# Patient Record
Sex: Male | Born: 1940 | Race: White | Hispanic: No
Health system: Southern US, Community
[De-identification: ages and names within clinical notes are randomized; demographics above are authoritative.]

---

## 2004-08-30 ENCOUNTER — Ambulatory Visit: Payer: Self-pay | Admitting: Oncology

## 2004-09-11 ENCOUNTER — Ambulatory Visit: Payer: Self-pay | Admitting: Oncology

## 2004-11-29 ENCOUNTER — Ambulatory Visit: Payer: Self-pay | Admitting: Oncology

## 2004-12-12 ENCOUNTER — Ambulatory Visit: Payer: Self-pay | Admitting: Oncology

## 2005-02-28 ENCOUNTER — Ambulatory Visit: Payer: Self-pay | Admitting: Oncology

## 2005-03-11 ENCOUNTER — Ambulatory Visit: Payer: Self-pay | Admitting: Oncology

## 2005-05-30 ENCOUNTER — Ambulatory Visit: Payer: Self-pay | Admitting: Oncology

## 2005-06-11 ENCOUNTER — Ambulatory Visit: Payer: Self-pay | Admitting: Oncology

## 2005-09-27 ENCOUNTER — Ambulatory Visit: Payer: Self-pay | Admitting: Oncology

## 2005-10-11 ENCOUNTER — Ambulatory Visit: Payer: Self-pay | Admitting: Oncology

## 2006-01-24 ENCOUNTER — Ambulatory Visit: Payer: Self-pay | Admitting: Oncology

## 2006-02-09 ENCOUNTER — Ambulatory Visit: Payer: Self-pay | Admitting: Oncology

## 2006-05-23 ENCOUNTER — Ambulatory Visit: Payer: Self-pay | Admitting: Oncology

## 2006-06-11 ENCOUNTER — Ambulatory Visit: Payer: Self-pay | Admitting: Oncology

## 2006-09-29 ENCOUNTER — Ambulatory Visit: Payer: Self-pay | Admitting: Oncology

## 2006-10-11 ENCOUNTER — Ambulatory Visit: Payer: Self-pay | Admitting: Oncology

## 2007-01-27 ENCOUNTER — Ambulatory Visit: Payer: Self-pay | Admitting: Oncology

## 2007-02-10 ENCOUNTER — Ambulatory Visit: Payer: Self-pay | Admitting: Oncology

## 2007-05-12 ENCOUNTER — Ambulatory Visit: Payer: Self-pay | Admitting: Oncology

## 2007-05-27 ENCOUNTER — Ambulatory Visit: Payer: Self-pay | Admitting: Oncology

## 2007-06-12 ENCOUNTER — Ambulatory Visit: Payer: Self-pay | Admitting: Oncology

## 2007-07-13 ENCOUNTER — Ambulatory Visit: Payer: Self-pay | Admitting: Oncology

## 2007-09-12 ENCOUNTER — Ambulatory Visit: Payer: Self-pay | Admitting: Oncology

## 2007-09-23 ENCOUNTER — Ambulatory Visit: Payer: Self-pay | Admitting: Oncology

## 2007-10-12 ENCOUNTER — Ambulatory Visit: Payer: Self-pay | Admitting: Oncology

## 2007-11-12 ENCOUNTER — Ambulatory Visit: Payer: Self-pay | Admitting: Oncology

## 2008-01-10 ENCOUNTER — Ambulatory Visit: Payer: Self-pay | Admitting: Oncology

## 2008-01-19 ENCOUNTER — Ambulatory Visit: Payer: Self-pay | Admitting: Oncology

## 2008-02-10 ENCOUNTER — Ambulatory Visit: Payer: Self-pay | Admitting: Oncology

## 2008-03-11 ENCOUNTER — Ambulatory Visit: Payer: Self-pay | Admitting: Oncology

## 2008-05-09 ENCOUNTER — Ambulatory Visit: Payer: Self-pay | Admitting: Unknown Physician Specialty

## 2008-05-11 ENCOUNTER — Ambulatory Visit: Payer: Self-pay | Admitting: Oncology

## 2008-06-11 ENCOUNTER — Ambulatory Visit: Payer: Self-pay | Admitting: Oncology

## 2008-08-11 ENCOUNTER — Ambulatory Visit: Payer: Self-pay | Admitting: Oncology

## 2008-09-11 ENCOUNTER — Ambulatory Visit: Payer: Self-pay | Admitting: Oncology

## 2009-02-09 ENCOUNTER — Ambulatory Visit: Payer: Self-pay | Admitting: Oncology

## 2009-02-15 ENCOUNTER — Ambulatory Visit: Payer: Self-pay | Admitting: Oncology

## 2009-03-03 ENCOUNTER — Ambulatory Visit: Payer: Self-pay | Admitting: Nephrology

## 2009-03-11 ENCOUNTER — Ambulatory Visit: Payer: Self-pay | Admitting: Oncology

## 2009-03-22 ENCOUNTER — Ambulatory Visit: Payer: Self-pay | Admitting: Urology

## 2009-04-11 ENCOUNTER — Ambulatory Visit: Payer: Self-pay | Admitting: Oncology

## 2009-04-12 ENCOUNTER — Ambulatory Visit: Payer: Self-pay | Admitting: Oncology

## 2009-05-11 ENCOUNTER — Ambulatory Visit: Payer: Self-pay | Admitting: Oncology

## 2009-07-12 ENCOUNTER — Ambulatory Visit: Payer: Self-pay | Admitting: Oncology

## 2009-07-21 ENCOUNTER — Ambulatory Visit: Payer: Self-pay | Admitting: Oncology

## 2009-08-07 ENCOUNTER — Ambulatory Visit: Payer: Self-pay | Admitting: Vascular Surgery

## 2009-08-08 ENCOUNTER — Inpatient Hospital Stay: Payer: Self-pay | Admitting: Internal Medicine

## 2009-08-11 ENCOUNTER — Ambulatory Visit: Payer: Self-pay | Admitting: Oncology

## 2009-08-16 ENCOUNTER — Inpatient Hospital Stay: Payer: Self-pay | Admitting: Internal Medicine

## 2009-08-31 ENCOUNTER — Ambulatory Visit: Payer: Self-pay | Admitting: Oncology

## 2009-09-11 ENCOUNTER — Ambulatory Visit: Payer: Self-pay | Admitting: Oncology

## 2009-12-12 ENCOUNTER — Ambulatory Visit: Payer: Self-pay | Admitting: Oncology

## 2009-12-22 ENCOUNTER — Inpatient Hospital Stay: Payer: Self-pay | Admitting: Internal Medicine

## 2009-12-29 ENCOUNTER — Ambulatory Visit: Payer: Self-pay | Admitting: Oncology

## 2010-01-09 ENCOUNTER — Ambulatory Visit: Payer: Self-pay | Admitting: Oncology

## 2010-03-13 ENCOUNTER — Inpatient Hospital Stay: Payer: Self-pay | Admitting: Internal Medicine

## 2010-07-12 ENCOUNTER — Ambulatory Visit: Payer: Self-pay | Admitting: Oncology

## 2010-07-13 ENCOUNTER — Ambulatory Visit: Payer: Self-pay | Admitting: Oncology

## 2010-08-11 ENCOUNTER — Ambulatory Visit: Payer: Self-pay | Admitting: Oncology

## 2011-01-04 ENCOUNTER — Ambulatory Visit: Payer: Self-pay | Admitting: Oncology

## 2011-01-10 ENCOUNTER — Ambulatory Visit: Payer: Self-pay | Admitting: Oncology

## 2011-02-10 ENCOUNTER — Ambulatory Visit: Payer: Self-pay | Admitting: Oncology

## 2011-02-25 ENCOUNTER — Inpatient Hospital Stay: Payer: Self-pay | Admitting: Specialist

## 2012-02-27 IMAGING — CR DG CHEST 1V PORT
1 series · 1 of 1 positions shown · non-contrast
Comparison: none

REASON FOR EXAM: shortness of breath
COMMENTS:

[view not recorded]
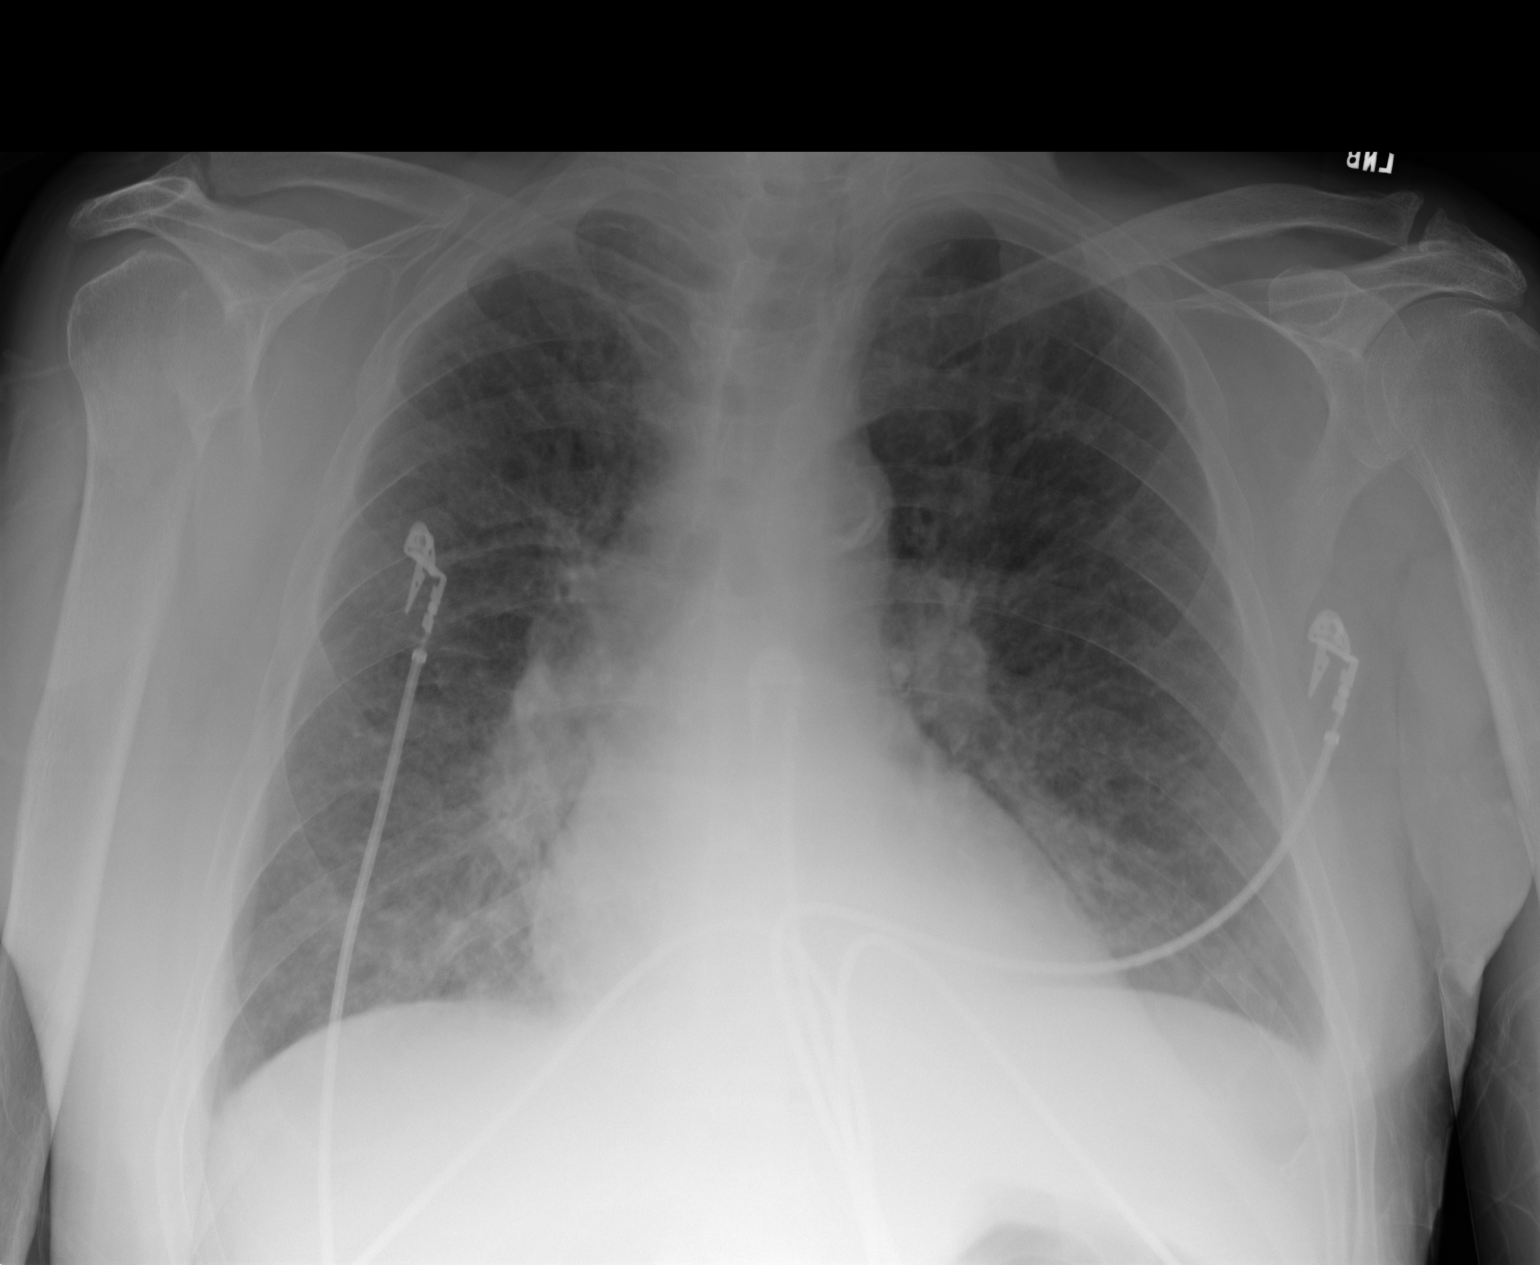

[1 of 1 positions shown; findings below may reference images not displayed]

PROCEDURE:     DXR - DXR PORTABLE CHEST SINGLE VIEW  - March 14, 2010  [DATE]

RESULT:     Comparison is made to the study of 03/13/2010.

The interstitial markings remain prominent. The heart is within normal
limits for size. Atherosclerotic calcification is present within the aortic
arch. Cardiac monitoring electrodes are present. Is no effusion or
pneumothorax.
IMPRESSION: Prominence of the pulmonary vasculature and interstitium
suggestive of pulmonary edema. Correlate clinically.

## 2012-05-03 ENCOUNTER — Emergency Department: Payer: Self-pay | Admitting: Emergency Medicine

## 2013-02-09 IMAGING — CT CT HEAD WITHOUT CONTRAST
2 series · 15 of 30 positions shown, 19 images · non-contrast
Comparison: none

REASON FOR EXAM: ams
COMMENTS:

[Series 2: without · axial · non-contrast · 0.40mm/px · z∈[-163,-43]mm · 13 of 30 slices shown, 17 images]
[im 3/30  brain]
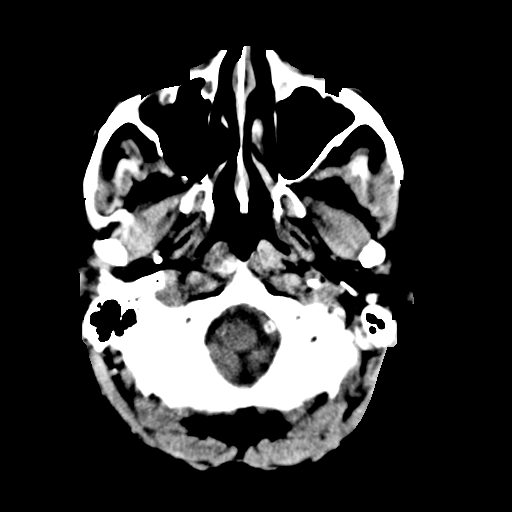
[im 3/30  bone]
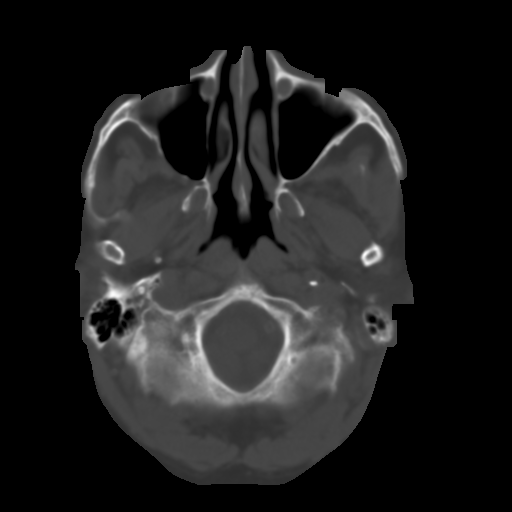
[im 5/30  brain]
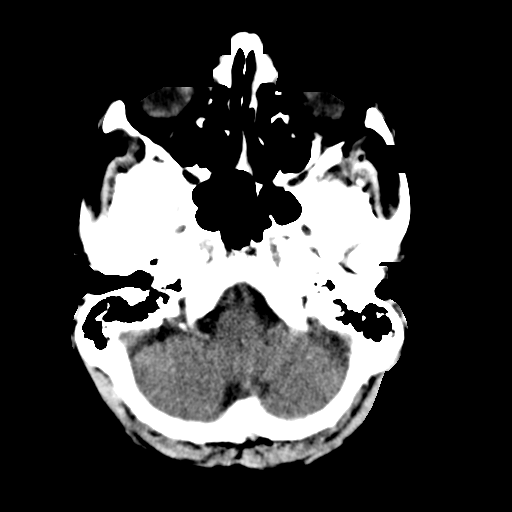
[im 7/30  brain]
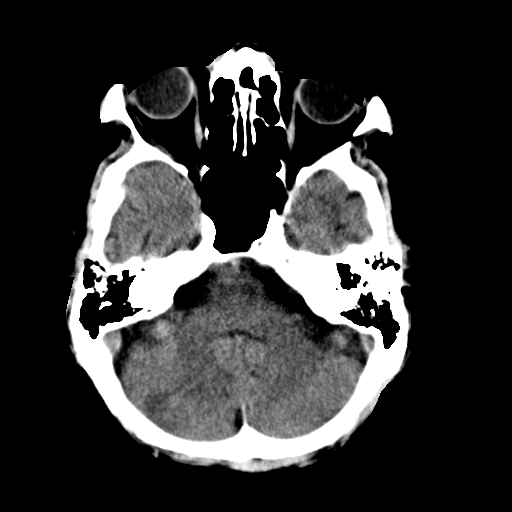
[im 9/30  brain]
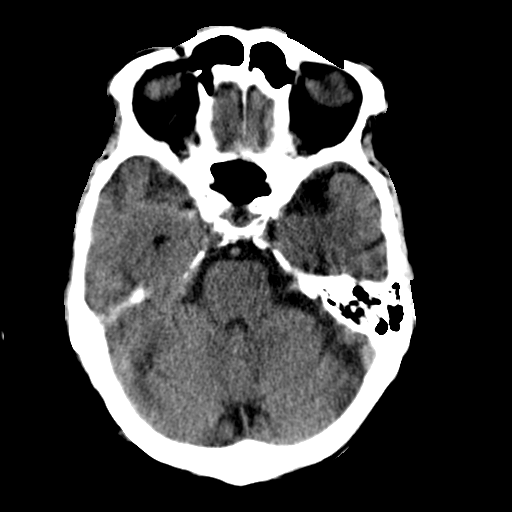
[im 11/30  brain]
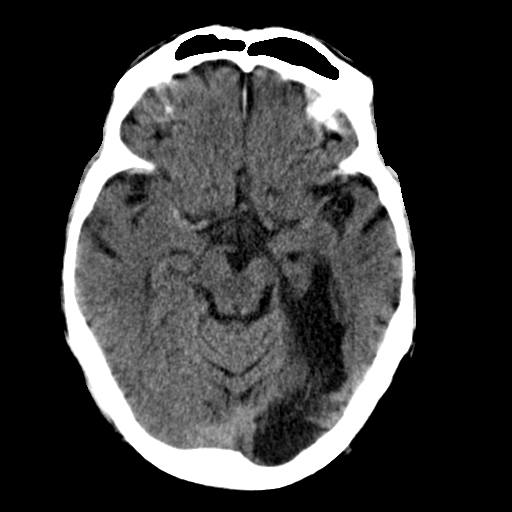
[im 11/30  bone]
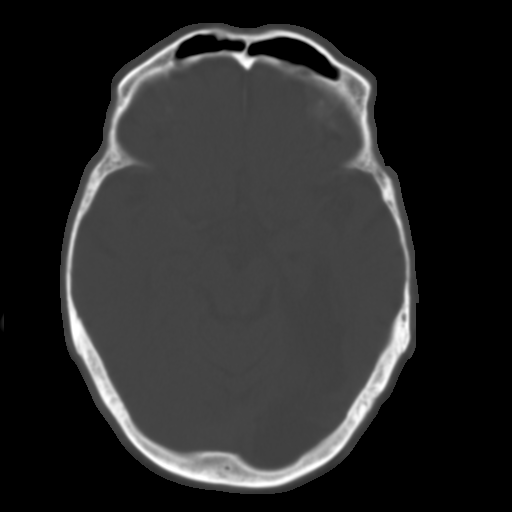
[im 13/30  brain]
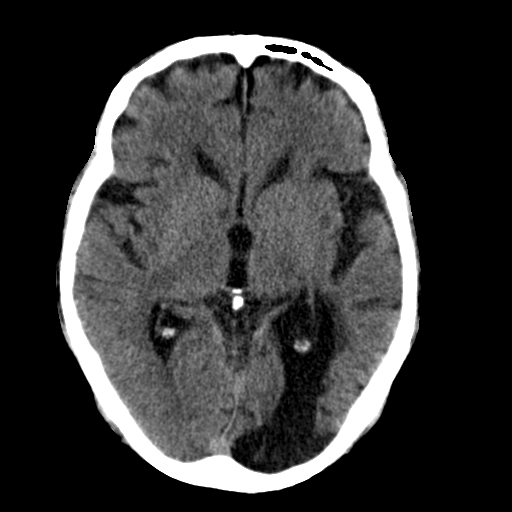
[im 15/30  brain]
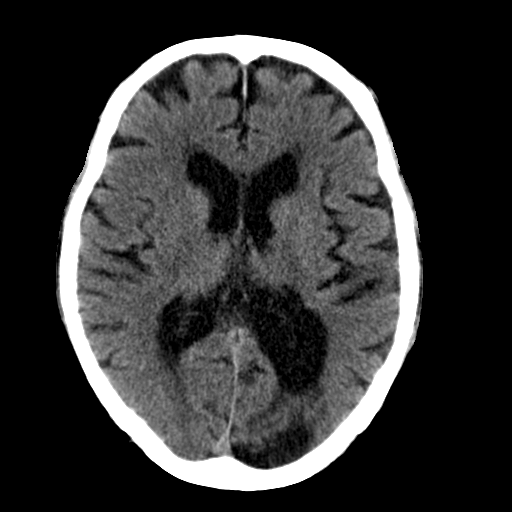
[im 17/30  brain]
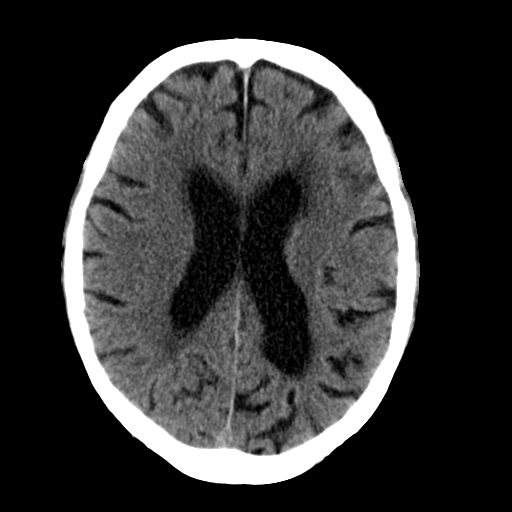
[im 19/30  brain]
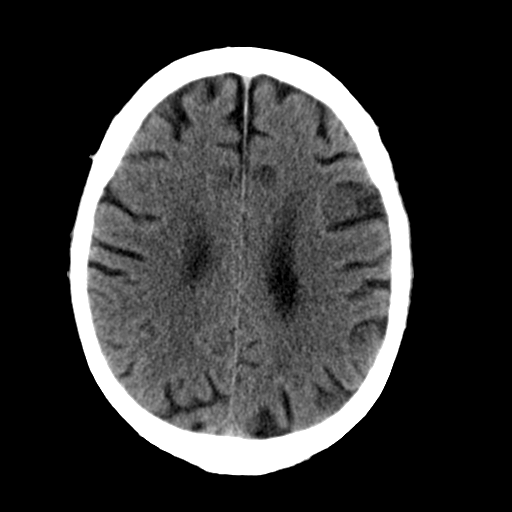
[im 19/30  bone]
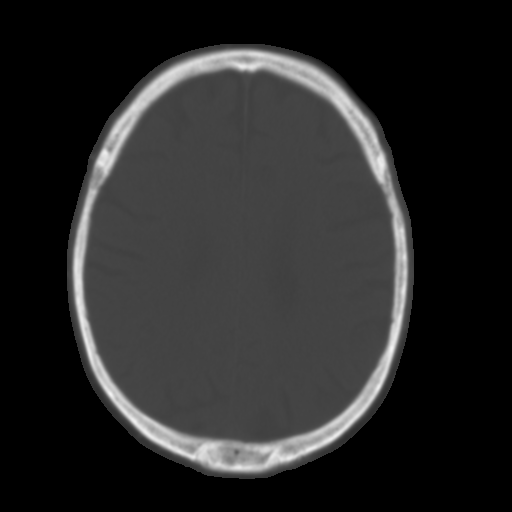
[im 21/30  brain]
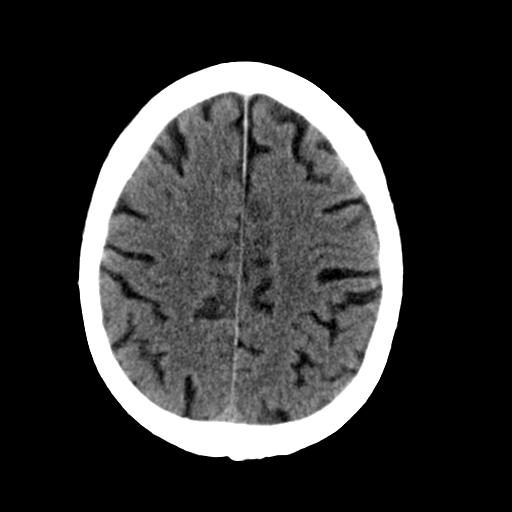
[im 23/30  brain]
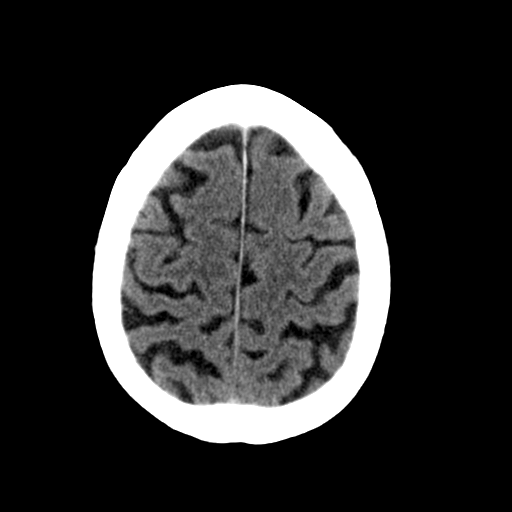
[im 25/30  brain]
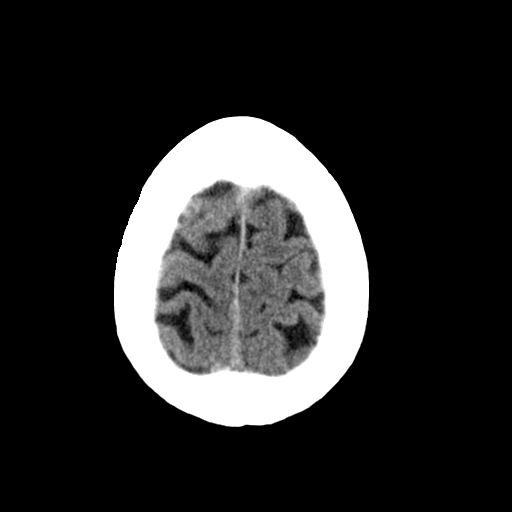
[im 27/30  brain]
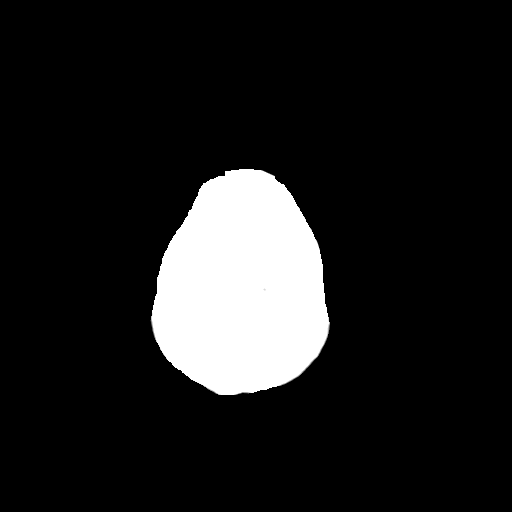
[im 27/30  bone]
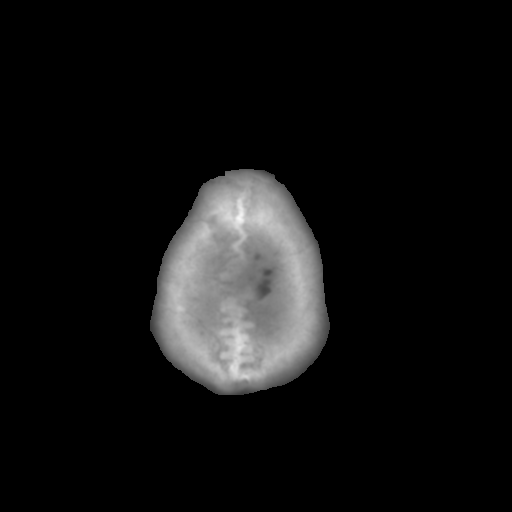

[Series 3: bone · axial · 0.40mm/px · z∈[-163,-143]mm · 2 of 29 slices shown]
[im 3/29  bone]
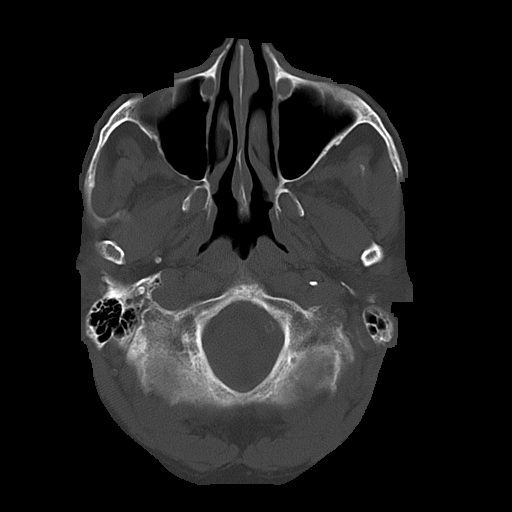
[im 7/29  bone]
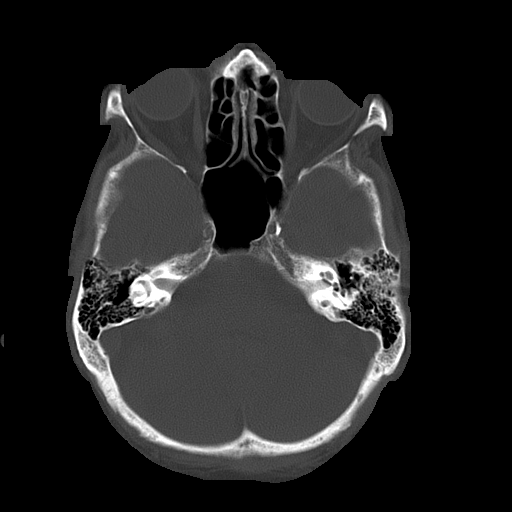

[15 of 30 positions shown; findings below may reference images not displayed]

PROCEDURE:     CT  - CT HEAD WITHOUT CONTRAST  - February 25, 2011 [DATE]

RESULT:     Axial noncontrast CT scanning was performed through the brain at
5 mm intervals and slice thicknesses. Comparison is made to a previous study
17 August, 2009.

There is a large area of encephalomalacia in the left posterior parietal and
occipital lobes. There is ex vacuo dilation of the posterior horn of the
left lateral ventricle. These findings appear stable. I do not see evidence
of an acute evolving ischemic infarction. There is no evidence of an acute
intracranial hemorrhage. There are stable hypodensities peripherally in the
posterior aspect of the right cerebellar hemisphere. There is moderate
amount of diffuse cerebral and cerebellar atrophy.

At bone window settings the observed portions of the paranasal sinuses and
mastoid air cells are clear. I do not see evidence of an acute skull
fracture.
IMPRESSION: 1. I do not see evidence of an acute ischemic or hemorrhagic event. There is
no evidence of intracranial mass effect or hydrocephalus.
2. There is chronic moderate diffuse cerebral and cerebellar atrophy. There
is evidence of a previous infarction in the occipital and posterior parietal
lobes on the left with encephalomalacia and ex vacuo dilation of the
posterior horn of the left lateral ventricle.
3. There are subtle changes in the periventricular deep white matter and in
the right cerebellar hemisphere consistent with chronic small vessel
ischemic type change.

## 2014-04-24 ENCOUNTER — Inpatient Hospital Stay: Payer: Self-pay | Admitting: Internal Medicine

## 2014-04-24 LAB — COMPREHENSIVE METABOLIC PANEL
Albumin: 3.5 g/dL (ref 3.4–5.0)
Alkaline Phosphatase: 126 U/L — ABNORMAL HIGH
Anion Gap: 7 (ref 7–16)
BILIRUBIN TOTAL: 1.3 mg/dL — AB (ref 0.2–1.0)
BUN: 41 mg/dL — AB (ref 7–18)
CALCIUM: 8.8 mg/dL (ref 8.5–10.1)
CO2: 22 mmol/L (ref 21–32)
Chloride: 106 mmol/L (ref 98–107)
Creatinine: 3.49 mg/dL — ABNORMAL HIGH (ref 0.60–1.30)
EGFR (African American): 19 — ABNORMAL LOW
GFR CALC NON AF AMER: 16 — AB
Glucose: 144 mg/dL — ABNORMAL HIGH (ref 65–99)
Osmolality: 283 (ref 275–301)
Potassium: 4.9 mmol/L (ref 3.5–5.1)
SGOT(AST): 27 U/L (ref 15–37)
SGPT (ALT): 28 U/L (ref 12–78)
Sodium: 135 mmol/L — ABNORMAL LOW (ref 136–145)
Total Protein: 7.3 g/dL (ref 6.4–8.2)

## 2014-04-24 LAB — CBC
HCT: 47.6 % (ref 40.0–52.0)
HGB: 16.1 g/dL (ref 13.0–18.0)
MCH: 34 pg (ref 26.0–34.0)
MCHC: 33.8 g/dL (ref 32.0–36.0)
MCV: 101 fL — AB (ref 80–100)
Platelet: 197 10*3/uL (ref 150–440)
RBC: 4.72 10*6/uL (ref 4.40–5.90)
RDW: 15.6 % — ABNORMAL HIGH (ref 11.5–14.5)
WBC: 14.8 10*3/uL — ABNORMAL HIGH (ref 3.8–10.6)

## 2014-04-24 LAB — TROPONIN I
TROPONIN-I: 0.89 ng/mL — AB
TROPONIN-I: 1.1 ng/mL — AB
TROPONIN-I: 0.76 ng/mL — AB
Troponin-I: 0.79 ng/mL — ABNORMAL HIGH

## 2014-04-24 LAB — CK-MB
CK-MB: 3.7 ng/mL — AB (ref 0.5–3.6)
CK-MB: 4 ng/mL — ABNORMAL HIGH (ref 0.5–3.6)
CK-MB: 4.3 ng/mL — AB (ref 0.5–3.6)
CK-MB: 5.5 ng/mL — AB (ref 0.5–3.6)

## 2014-04-24 LAB — APTT: ACTIVATED PTT: 51.1 s — AB (ref 23.6–35.9)

## 2014-04-24 LAB — PROTIME-INR
INR: 2.3
PROTHROMBIN TIME: 25 s — AB (ref 11.5–14.7)

## 2014-04-24 LAB — PRO B NATRIURETIC PEPTIDE: B-TYPE NATIURETIC PEPTID: 12431 pg/mL — AB (ref 0–450)

## 2014-04-25 LAB — BASIC METABOLIC PANEL
ANION GAP: 7 (ref 7–16)
BUN: 46 mg/dL — ABNORMAL HIGH (ref 7–18)
CHLORIDE: 106 mmol/L (ref 98–107)
CO2: 23 mmol/L (ref 21–32)
Calcium, Total: 9.1 mg/dL (ref 8.5–10.1)
Creatinine: 3.45 mg/dL — ABNORMAL HIGH (ref 0.60–1.30)
EGFR (African American): 19 — ABNORMAL LOW
GFR CALC NON AF AMER: 16 — AB
Glucose: 97 mg/dL (ref 65–99)
Osmolality: 284 (ref 275–301)
Potassium: 4.8 mmol/L (ref 3.5–5.1)
Sodium: 136 mmol/L (ref 136–145)

## 2014-04-25 LAB — CBC WITH DIFFERENTIAL/PLATELET
Basophil #: 0.1 10*3/uL (ref 0.0–0.1)
Basophil %: 0.9 %
Eosinophil #: 0.3 10*3/uL (ref 0.0–0.7)
Eosinophil %: 3 %
HCT: 43.9 % (ref 40.0–52.0)
HGB: 15.1 g/dL (ref 13.0–18.0)
Lymphocyte #: 0.9 10*3/uL — ABNORMAL LOW (ref 1.0–3.6)
Lymphocyte %: 10.1 %
MCH: 34.2 pg — ABNORMAL HIGH (ref 26.0–34.0)
MCHC: 34.3 g/dL (ref 32.0–36.0)
MCV: 100 fL (ref 80–100)
Monocyte #: 0.8 x10 3/mm (ref 0.2–1.0)
Monocyte %: 9.2 %
NEUTROS ABS: 7.1 10*3/uL — AB (ref 1.4–6.5)
NEUTROS PCT: 76.8 %
PLATELETS: 171 10*3/uL (ref 150–440)
RBC: 4.4 10*6/uL (ref 4.40–5.90)
RDW: 15.4 % — ABNORMAL HIGH (ref 11.5–14.5)
WBC: 9.2 10*3/uL (ref 3.8–10.6)

## 2014-04-25 LAB — DIGOXIN LEVEL: Digoxin: 1.44 ng/mL

## 2014-04-25 LAB — PROTIME-INR
INR: 2.9
Prothrombin Time: 29.7 secs — ABNORMAL HIGH (ref 11.5–14.7)

## 2014-04-26 LAB — PROTIME-INR
INR: 3.6
Prothrombin Time: 34.6 secs — ABNORMAL HIGH (ref 11.5–14.7)

## 2014-04-26 LAB — BASIC METABOLIC PANEL
Anion Gap: 8 (ref 7–16)
BUN: 54 mg/dL — ABNORMAL HIGH (ref 7–18)
CALCIUM: 9.4 mg/dL (ref 8.5–10.1)
CREATININE: 3.65 mg/dL — AB (ref 0.60–1.30)
Chloride: 102 mmol/L (ref 98–107)
Co2: 25 mmol/L (ref 21–32)
EGFR (African American): 18 — ABNORMAL LOW
EGFR (Non-African Amer.): 15 — ABNORMAL LOW
GLUCOSE: 84 mg/dL (ref 65–99)
Osmolality: 284 (ref 275–301)
Potassium: 4.5 mmol/L (ref 3.5–5.1)
SODIUM: 135 mmol/L — AB (ref 136–145)

## 2014-04-26 LAB — DIGOXIN LEVEL: Digoxin: 1.13 ng/mL

## 2014-11-11 ENCOUNTER — Ambulatory Visit: Payer: Self-pay | Admitting: Internal Medicine

## 2014-11-14 ENCOUNTER — Inpatient Hospital Stay: Payer: Self-pay | Admitting: Internal Medicine

## 2014-11-14 LAB — BASIC METABOLIC PANEL
ANION GAP: 9 (ref 7–16)
Anion Gap: 10 (ref 7–16)
BUN: 78 mg/dL — ABNORMAL HIGH (ref 7–18)
BUN: 79 mg/dL — ABNORMAL HIGH (ref 7–18)
CALCIUM: 9.4 mg/dL (ref 8.5–10.1)
CHLORIDE: 105 mmol/L (ref 98–107)
CHLORIDE: 106 mmol/L (ref 98–107)
CO2: 18 mmol/L — AB (ref 21–32)
Calcium, Total: 8.5 mg/dL (ref 8.5–10.1)
Co2: 20 mmol/L — ABNORMAL LOW (ref 21–32)
Creatinine: 4.47 mg/dL — ABNORMAL HIGH (ref 0.60–1.30)
Creatinine: 4.51 mg/dL — ABNORMAL HIGH (ref 0.60–1.30)
EGFR (African American): 17 — ABNORMAL LOW
EGFR (African American): 16 — ABNORMAL LOW
EGFR (Non-African Amer.): 14 — ABNORMAL LOW
GFR CALC NON AF AMER: 14 — AB
GLUCOSE: 103 mg/dL — AB (ref 65–99)
Glucose: 150 mg/dL — ABNORMAL HIGH (ref 65–99)
Osmolality: 293 (ref 275–301)
Osmolality: 294 (ref 275–301)
POTASSIUM: 6.4 mmol/L — AB (ref 3.5–5.1)
POTASSIUM: 5 mmol/L (ref 3.5–5.1)
SODIUM: 133 mmol/L — AB (ref 136–145)
Sodium: 135 mmol/L — ABNORMAL LOW (ref 136–145)

## 2014-11-14 LAB — CBC
HCT: 44.7 % (ref 40.0–52.0)
HGB: 14.8 g/dL (ref 13.0–18.0)
MCH: 33.3 pg (ref 26.0–34.0)
MCHC: 33.1 g/dL (ref 32.0–36.0)
MCV: 101 fL — AB (ref 80–100)
Platelet: 225 10*3/uL (ref 150–440)
RBC: 4.45 10*6/uL (ref 4.40–5.90)
RDW: 14.9 % — ABNORMAL HIGH (ref 11.5–14.5)
WBC: 15 10*3/uL — AB (ref 3.8–10.6)

## 2014-11-14 LAB — URINALYSIS, COMPLETE
BLOOD: NEGATIVE
Bilirubin,UR: NEGATIVE
GLUCOSE, UR: NEGATIVE mg/dL (ref 0–75)
Hyaline Cast: 2
Ketone: NEGATIVE
LEUKOCYTE ESTERASE: NEGATIVE
NITRITE: NEGATIVE
PH: 5 (ref 4.5–8.0)
PROTEIN: NEGATIVE
RBC,UR: 1 /HPF (ref 0–5)
Specific Gravity: 1.015 (ref 1.003–1.030)
Squamous Epithelial: <1
WBC UR: <1 /HPF (ref 0–5)

## 2014-11-14 LAB — TROPONIN I
TROPONIN-I: 1.6 ng/mL — AB
TROPONIN-I: 3.1 ng/mL — AB
Troponin-I: 1.6 ng/mL — ABNORMAL HIGH

## 2014-11-14 LAB — CK-MB
CK-MB: 6.5 ng/mL — ABNORMAL HIGH (ref 0.5–3.6)
CK-MB: 6.2 ng/mL — ABNORMAL HIGH (ref 0.5–3.6)
CK-MB: 6.1 ng/mL — AB (ref 0.5–3.6)

## 2014-11-14 LAB — PROTIME-INR
INR: 2.3
Prothrombin Time: 25.1 secs — ABNORMAL HIGH (ref 11.5–14.7)

## 2014-11-14 LAB — PRO B NATRIURETIC PEPTIDE: B-TYPE NATIURETIC PEPTID: 49903 pg/mL — AB (ref 0–450)

## 2014-11-15 LAB — COMPREHENSIVE METABOLIC PANEL
ANION GAP: 12 (ref 7–16)
AST: 52 U/L — AB (ref 15–37)
Albumin: 2.8 g/dL — ABNORMAL LOW (ref 3.4–5.0)
Alkaline Phosphatase: 87 U/L
BUN: 88 mg/dL — AB (ref 7–18)
Bilirubin,Total: 1 mg/dL (ref 0.2–1.0)
CALCIUM: 8.6 mg/dL (ref 8.5–10.1)
CREATININE: 4.76 mg/dL — AB (ref 0.60–1.30)
Chloride: 108 mmol/L — ABNORMAL HIGH (ref 98–107)
Co2: 19 mmol/L — ABNORMAL LOW (ref 21–32)
EGFR (African American): 16 — ABNORMAL LOW
EGFR (Non-African Amer.): 13 — ABNORMAL LOW
Glucose: 115 mg/dL — ABNORMAL HIGH (ref 65–99)
Osmolality: 305 (ref 275–301)
Potassium: 4.8 mmol/L (ref 3.5–5.1)
SGPT (ALT): 20 U/L
SODIUM: 139 mmol/L (ref 136–145)
TOTAL PROTEIN: 6.2 g/dL — AB (ref 6.4–8.2)

## 2014-11-15 LAB — CBC WITH DIFFERENTIAL/PLATELET
BASOS ABS: 0.1 10*3/uL (ref 0.0–0.1)
Basophil %: 0.5 %
Eosinophil #: 0.2 10*3/uL (ref 0.0–0.7)
Eosinophil %: 1.8 %
HCT: 37 % — ABNORMAL LOW (ref 40.0–52.0)
HGB: 12.7 g/dL — ABNORMAL LOW (ref 13.0–18.0)
LYMPHS PCT: 4.5 %
Lymphocyte #: 0.4 10*3/uL — ABNORMAL LOW (ref 1.0–3.6)
MCH: 33.9 pg (ref 26.0–34.0)
MCHC: 34.2 g/dL (ref 32.0–36.0)
MCV: 99 fL (ref 80–100)
Monocyte #: 0.8 x10 3/mm (ref 0.2–1.0)
Monocyte %: 8.6 %
NEUTROS PCT: 84.6 %
Neutrophil #: 8.1 10*3/uL — ABNORMAL HIGH (ref 1.4–6.5)
PLATELETS: 166 10*3/uL (ref 150–440)
RBC: 3.74 10*6/uL — ABNORMAL LOW (ref 4.40–5.90)
RDW: 15.2 % — ABNORMAL HIGH (ref 11.5–14.5)
WBC: 9.6 10*3/uL (ref 3.8–10.6)

## 2014-11-15 LAB — PROTIME-INR
INR: 3.1
Prothrombin Time: 30.8 secs — ABNORMAL HIGH (ref 11.5–14.7)

## 2014-11-15 LAB — PRO B NATRIURETIC PEPTIDE: B-Type Natriuretic Peptide: 47460 pg/mL — ABNORMAL HIGH (ref 0–450)

## 2014-11-15 LAB — MAGNESIUM: Magnesium: 1.9 mg/dL

## 2014-11-16 LAB — BASIC METABOLIC PANEL
ANION GAP: 14 (ref 7–16)
BUN: 99 mg/dL — AB (ref 7–18)
CO2: 16 mmol/L — AB (ref 21–32)
Calcium, Total: 9.2 mg/dL (ref 8.5–10.1)
Chloride: 104 mmol/L (ref 98–107)
Creatinine: 5.27 mg/dL — ABNORMAL HIGH (ref 0.60–1.30)
EGFR (African American): 14 — ABNORMAL LOW
EGFR (Non-African Amer.): 11 — ABNORMAL LOW
Glucose: 124 mg/dL — ABNORMAL HIGH (ref 65–99)
OSMOLALITY: 300 (ref 275–301)
Potassium: 5 mmol/L (ref 3.5–5.1)
Sodium: 134 mmol/L — ABNORMAL LOW (ref 136–145)

## 2014-11-16 LAB — CBC WITH DIFFERENTIAL/PLATELET
BASOS ABS: 0.1 10*3/uL (ref 0.0–0.1)
Basophil %: 0.5 %
EOS ABS: 0.1 10*3/uL (ref 0.0–0.7)
Eosinophil %: 1.1 %
HCT: 38.7 % — AB (ref 40.0–52.0)
HGB: 12.9 g/dL — ABNORMAL LOW (ref 13.0–18.0)
LYMPHS ABS: 0.5 10*3/uL — AB (ref 1.0–3.6)
LYMPHS PCT: 4 %
MCH: 33.6 pg (ref 26.0–34.0)
MCHC: 33.3 g/dL (ref 32.0–36.0)
MCV: 101 fL — ABNORMAL HIGH (ref 80–100)
Monocyte #: 0.9 x10 3/mm (ref 0.2–1.0)
Monocyte %: 7.7 %
NEUTROS ABS: 10.5 10*3/uL — AB (ref 1.4–6.5)
Neutrophil %: 86.7 %
Platelet: 200 10*3/uL (ref 150–440)
RBC: 3.84 10*6/uL — ABNORMAL LOW (ref 4.40–5.90)
RDW: 15 % — AB (ref 11.5–14.5)
WBC: 12.1 10*3/uL — ABNORMAL HIGH (ref 3.8–10.6)

## 2014-11-16 LAB — PROTIME-INR
INR: 3.3
PROTHROMBIN TIME: 32.5 s — AB (ref 11.5–14.7)

## 2014-11-16 LAB — MAGNESIUM: Magnesium: 2 mg/dL

## 2014-11-19 LAB — CULTURE, BLOOD (SINGLE)

## 2014-12-12 ENCOUNTER — Ambulatory Visit: Payer: Self-pay | Admitting: Internal Medicine

## 2014-12-12 DEATH — deceased

## 2015-03-04 NOTE — H&P (Signed)
PATIENT NAME:  Danny Armstrong, Danny Armstrong MR#:  [id number] DATE OF BIRTH:  1941/05/14  DATE OF ADMISSION:  04/24/2014  REFERRING PHYSICIAN: Dr. Loleta Roseory Forbach.   CHIEF COMPLAINT: Shortness of breath.   HISTORY OF PRESENT ILLNESS: Danny Armstrong is a 74 year old pleasant white male with past medical history of ischemic cardiomyopathy with EF of 25%, chronic congestive heart failure, hypertension, hyperlipidemia, paroxysmal atrial fibrillation, presented to the Emergency Department with complaints of shortness of breath, which has been progressing since Monday. The patient states the patient had change in the timings of his medications since Monday, since then noticed to have increased swelling in the lower extremities, shortness of breath with exertion. Tonight, the patient had severe shortness of breath and called EMS. The patient's initial oxygen saturation were 88. The patient was placed on BiPAP and is brought to the Emergency Department. Workup in the Emergency Department with chest x-ray shows vascular congestion, bibasilar air space opacities, likely reflect pulmonary edema. The patient denies having any cough, has orthopnea. Initial set of cardiac enzymes are 0.79, CK-MB of 3.7. The patient has elevated BNP of 12,000. The patient has a chronic kidney disease. The patient received 1 dose of Lasix IV in the emergency department.   PAST MEDICAL HISTORY:  1. Congestive heart failure with EF of 25%.  2. Paroxysmal atrial fibrillation, on chronic anticoagulation.  3. Chronic kidney disease.  4. History of kidney cancer.  5. Coronary artery disease.  6. History of CVA.  7. Chronic left carotid artery stenosis.  8. History of polycythemia vera.   PAST SURGICAL HISTORY:  1. Nephrectomy secondary to renal cell cancer.  2. Basal cell carcinoma of the face, stage IV.  3. Hernia repair.  4. Cataract surgery.   ALLERGIES: No known drug allergies.   HOME MEDICATIONS:  1. Vitamin D 3000 units once a day.  2. Norvasc  5 mg once a day.  3. Namenda XR 1 capsule once a day.  4. Namenda 10 mg 2 times a day.  5. Metoprolol 100 mg once a day.  6. Lipitor 40 mg once a day.  7. Lasix 20 mg once a day.  8. Keppra 500 mg 2 times a day.  9. Ferrous sulfate 1 tablet 2 times a day.  10. Diltiazem 120 mg once a day.  11. Digoxin 125 mcg every other day.  12. Coumadin 1.5 mg once a day Monday, Tuesday, Wednesday, Thursday, and Friday.  13. Coumadin 2 mg Saturday and Sunday.  14. Citalopram 10 mg once a day.  15. Benicar 40 mg once a day.  16. Aspirin 81 mg once a daily.  17. Advair Diskus 250 mcg 2 times a day.  18. Acetaminophen 500 mg 2 times a day as needed.   SOCIAL HISTORY: Former smoker.   FAMILY HISTORY: Hypertension and stroke.   REVIEW OF SYSTEMS:   CONSTITUTIONAL: Experiencing generalized weakness.  EYES: No change in vision.  ENT: No change in hearing.  RESPIRATORY: Has shortness of breath.  CARDIOVASCULAR: No chest pain, palpations.  GASTROINTESTINAL: No nausea, vomiting, abdominal pain.  GENITOURINARY: No dysuria or hematuria.  HEMATOLOGIC: No easy bruising or bleeding.  SKIN: No rash or lesions.  MUSCULOSKELETAL: Has osteoarthritis.  NEUROLOGIC: History of CVA in the past.   PHYSICAL EXAMINATION:  GENERAL: This is a well-built, well-nourished, age-appropriate male lying down in the bed on BiPAP.  VITAL SIGNS: Temperature 98.5, pulse 78, blood pressure 174/71, respiratory rate of 24. Oxygen saturations: Initial 88%, currently 98% on BiPAP.  HEENT: Head  normocephalic, atraumatic. No scleral icterus. Conjunctivae normal. Pupils equal and react to light. Extraocular movements are intact. Mucous membranes moist. No pharyngeal erythema.  NECK: Supple. No lymphadenopathy. No JVD. No carotid bruit. No thyromegaly.  CHEST: Has no focal tenderness. Crackles are heard bibasilar.  HEART: S1, S2 regular.  ABDOMEN: Bowel sounds present. Soft, nontender, nondistended. No hepatosplenomegaly.   EXTREMITIES: 3 to 4+ pitting edema extending up to the thighs.  SKIN: No rashes or lesions.  MUSCULOSKELETAL: Good range of motion in all the extremities.  NEUROLOGIC: The patient is alert, oriented to place, person, and time. Cranial nerves II-XII intact. Motor 5/5 in upper and lower extremities.   LABORATORIES:  1. CMP: BUN of 41, creatinine of 3.4. The rest of all the values are within normal limits and BNP is 12,500.  2. CK-MB 3.7.  3. Troponin 0.79.   EKG, 12-lead, shows normal sinus rhythm, poor R wave progression in the inferior leads. Repolarization changes.   ASSESSMENT AND PLAN: [name] is a 74 year old male who comes to the Emergency Department with congestive heart failure.  1. Congestive heart failure, systolic acute on chronic. Fluid restriction of 1200 mL. Continue with IV Lasix with strict I's and O's, daily weights. The cause for the decompensation is uncertain. There is a possibility that the patient had some mild underlying pneumonia.  2. Elevated troponins. This could be secondary to chronic renal insufficiency. We will continue to follow up. If the patient continues to have elevated troponins, will start the patient on a heparin drip.  3. A questionable pneumonia. Chest x-ray commented as questionable infiltrates. The patient received Rocephin and Zithromax in the emergency department. We will continue for now. The patient has denied having any cough, denied having any fever; however, CBC is currently pending.  4. Paroxysmal atrial fibrillation, currently in normal sinus rhythm. Continue the Coumadin. Check the PT/INR.  5. The patient is on therapeutic Coumadin which should provide the deep vein prophylaxis.   TIME SPENT: 50 minutes.    ____________________________ Susa Griffins, MD pv:lt D: 04/24/2014 05:22:51 ET T: 04/24/2014 06:22:35 ET JOB#: [id number]  cc: Susa Griffins, MD, <Dictator> Rhona Leavens. Burnett Sheng, MD Clerance Lav Kynlee Koenigsberg MD ELECTRONICALLY SIGNED  04/27/2014 7:21

## 2015-03-04 NOTE — Discharge Summary (Signed)
PATIENT NAME:  Danny Armstrong, Danny Armstrong MR#:  [id number] DATE OF BIRTH:  March 11, 1941  DATE OF ADMISSION:  04/24/2014 DATE OF DISCHARGE:  04/26/2014  PRIMARY CARE PHYSICIAN:  Rhona LeavensJames F. Burnett ShengHedrick, MD  FINAL DIAGNOSES: 1.  Acute respiratory failure which resolved.  2.  Acute systolic congestive heart failure.  3.  Chronic kidney disease stage 4.  4.  Hypertension.  5.  Atrial fibrillation.  Hold Coumadin today, 04/26/2014. Check INR weekly on Thursday. Check a digoxin level weekly also.   MEDICATIONS ON DISCHARGE: Include aspirin 81 mg daily, Lipitor 40 mg daily, diltiazem  ER 120 mg at bedtime, Advair Diskus 250/50 one inhalation twice a day, Keppra 500 mg twice a day, metoprolol extended-release 100 mg daily, Coumadin 2 mg 1 tablet on Saturday, Sunday; Coumadin 3 mg half tablet Monday, Tuesday, Wednesday, Thursday, Friday. Namenda XR 28 mg 1 capsule once a day, digoxin 125 mcg 1 tablet every other day, hold if pulse less than 50; Norvasc 5 mg 1 tablet daily in the morning, vitamin D 1000 international units daily, Benicar 40 mg daily, Celexa 10 mg daily, ferrous sulfate 325 mg twice a day, acetaminophen 500 mg 2 tablets every 6 hours as needed for pain, furosemide 40 mg twice a day, azithromycin 250 mg daily for 2 more days, cefuroxime 250 mg 1 tablet every 12 hours for 7 days.   DIET: Low sodium diet, regular consistency.   ACTIVITY: As tolerated.   FOLLOWUP: In 1 to 2 weeks with Dr. Burnett ShengHedrick.   HOSPITAL COURSE: The patient was admitted 04/24/2014 and discharged 04/26/2014. The patient came in with shortness of breath, admitted with acute respiratory failure, congestive heart failure, elevated troponin, was started on IV Lasix. For questionable pneumonia, the patient received IV Rocephin and Zithromax in the Emergency Room and that was continued.   LABORATORY, DIAGNOSTIC AND RADIOLOGICAL DATA DURING THE HOSPITAL COURSE: Included an INR of 2.3 on admission. BNP 12,431. Glucose of 144, BUN 41, creatinine 3.49,  sodium 135, potassium 4.9, chloride 106, CO2 22, calcium 8.8. Liver function tests: Alkaline phosphatase 126, ALT 28, AST 27. Troponin borderline at 0.79. White blood cell count 14.8, hemoglobin and hematocrit 16.1 and 47.6, platelet count 197. EKG showed normal sinus rhythm, left ventricular hypertrophy with repolarization abnormality. Chest x-ray showed vascular congestion, bilateral airspace opacities likely reflect pulmonary edema though pneumonia may have a similar appearance. Troponin went up to 1.1 and then trended down to 0.76. Digoxin level 1.44. Echocardiogram showed ejection fraction 30% to 35%, inferior wall mid apical inferior septum, basal and mid inferior lateral wall are abnormal, dilated right and left atria, moderate pleural effusion, moderate mitral valve regurgitation. INR upon discharge 3.6. Creatinine upon discharge 3.65. Digoxin 1.13. Potassium 4.5.   HOSPITAL COURSE PER PROBLEM LIST:  1.  For the patient's acute respiratory failure, initially the patient required BiPAP on presentation, upon discharge was breathing comfortably on room air. Pulse oximetry by myself on room air was 94% upon discharge. Acute respiratory failure had resolved.  2.  Acute systolic congestive heart failure with cardiomyopathy and moderate mitral regurgitation. Patient was diuresed with IV Lasix 40 mg IV b.i.d. and then switched over to  Lasix to 40 mg b.i.d.  The patient is already on metoprolol ER and Benicar for further treatment of acute systolic congestive heart failure. The patient's lungs were clear upon discharge  3.  Chronic kidney disease stage 4, borderline 5. The patient's creatinine had remained stable, was seen in consultation by Dr. Thedore MinsSingh, nephrology.  4.  Questionable pneumonia. The patient was given antibiotics with Rocephin and Zithromax. This was continued upon discharge, Ceftin and Zithromax.  5.  Hypertension. Blood pressure stable upon discharge, 130/75.  6.  Atrial fibrillation. The  patient is therapeutic on Coumadin. Coumadin to be held on 06/16 for high INR. Recheck on Thursday. Go back to usual dose on Wednesday. Likely high secondary to antibiotics. The patient is rate controlled with diltiazem, metoprolol and every-other-day dosing of digoxin. Need to be careful with digoxin and chronic kidney disease, level can increase quite quickly. Level here in the hospital was okay but can consider getting rid of this medication and increasing the Cardizem CD. I will leave that up to Dr. Burnett Sheng and/or cardiology.   TIME SPENT ON DISCHARGE: 35 minutes.    ____________________________ Herschell Dimes. Renae Gloss, MD rjw:cs D: 04/26/2014 14:10:59 ET T: 04/26/2014 14:48:51 ET JOB#: [id number]  cc: Herschell Dimes. Renae Gloss, MD, <Dictator> Rhona Leavens. Burnett Sheng, MD Salley Scarlet MD ELECTRONICALLY SIGNED 04/29/2014 16:17

## 2015-03-04 NOTE — Consult Note (Signed)
PATIENT NAME:  Danny Armstrong, Danny Armstrong MR#:  [id number] DATE OF BIRTH:  09/13/1941  DATE OF CONSULTATION:  04/24/2014  REFERRING PHYSICIAN:   CONSULTING PHYSICIAN:  Marcina Millard, MD  PRIMARY CARE PHYSICIAN: Rhona Leavens. Burnett Sheng, MD  CARDIOLOGIST: Darlin Priestly. Lady Gary, MD.   CHIEF COMPLAINT: Shortness of breath.   HISTORY OF PRESENT ILLNESS: The patient is a 74 year old gentleman with history of coronary artery disease, ischemic cardiomyopathy with LVEF of 25%, chronic systolic congestive heart failure and atrial fibrillation. The patient apparently was in his usual state of health until earlier this week when he started experiencing increasing peripheral edema and shortness of breath. He presented to [facility] Emergency Room with a chief complaint of shortness of breath, initial oxygen saturation was 88% and the patient was placed on BiPAP. Chest x-ray revealed evidence for pulmonary edema. Admission labs were notable for borderline elevated troponin of 0.79. BUN and creatinine were 41 and 3.49 respectively. The patient is currently followed by Dr. Thedore Mins for chronic kidney disease.   PAST MEDICAL HISTORY: 1.  Coronary artery disease.  2.  Ischemic cardiomyopathy, LVEF 25%.  3.  Atrial fibrillation.  4.  Chronic kidney disease.  5.  History of cerebrovascular accident.  6.  Left carotid artery stenosis.  7.  History of polycythemia vera.  8.  History of renal cell cancer, status post nephrectomy.   MEDICATIONS: Amlodipine 5 mg daily, metoprolol 100 mg daily, Lipitor 40 mg daily, furosemide 20 mg daily, diltiazem 120 mg daily, digoxin 125 mcg daily, Coumadin 1.5 mg Monday to Friday, 2 mg Saturday and Sunday; Benicar 40 mg daily, aspirin 81 mg daily, Namenda XR 1 capsule daily, Keppra 500 mg b.i.d., ferrous sulfate 1 b.i.d., citalopram 10 mg daily, Advair Diskus 250 two puffs b.i.d.   SOCIAL HISTORY: The patient has a remote history of tobacco abuse.   FAMILY HISTORY: No immediate family history of coronary  artery disease or myocardial infarction.   REVIEW OF SYSTEMS:    CONSTITUTIONAL: No fever or chills.  EYES: No blurry vision.  EARS: No hearing loss.  RESPIRATORY: Shortness of breath as described above.  CARDIOVASCULAR: The patient denies chest pain. He does have peripheral edema.  GASTROINTESTINAL: No nausea, vomiting or diarrhea.  GENITOURINARY: No dysuria or hematuria.  ENDOCRINE: No polyuria or polydipsia.  HEMATOLOGICAL: No easy bruising or bleeding.  INTEGUMENTARY: No rash.  MUSCULOSKELETAL: No arthralgias or myalgias.  NEUROLOGICAL: The patient has a history of prior cerebrovascular accident.   PHYSICAL EXAMINATION: VITAL SIGNS: Blood pressure 154/78, pulse 78, respirations 24, temperature 97.6, pulse oximetry 97%.  HEENT: Pupils equal, reactive to light and accommodation.  NECK: Supple without thyromegaly.  LUNGS: Decreased breath sounds in both bases.  HEART: Normal JVP. Diffuse PMI. Regular rate and rhythm. Normal S1, S2. No appreciable gallop, murmur or rub.  ABDOMEN: Soft and nontender. Pulses were intact bilaterally.  EXTREMITIES:  Has 1 to 2+ bilateral pedal edema.  MUSCULOSKELETAL: Normal muscle tone.  NEUROLOGIC: The patient is alert and oriented x 3. Motor and sensory both grossly intact.   IMPRESSION: A 74 year old gentleman with known history of coronary artery disease, ischemic cardiomyopathy and chronic systolic congestive heart failure who presents with acute on chronic systolic congestive heart failure with peripheral edema and pulmonary edema, improved after initial dose of intravenous furosemide and diuresis. The patient has borderline elevated troponin in the absence of chest pain, likely representing demand/supply ischemia and not due to acute coronary syndrome.   RECOMMENDATIONS: 1.  Agree with overall current therapy.  2.  Continue diuresis.  3.  Defer full-dose anticoagulation.  4.  Would defer further noninvasive or invasive cardiac evaluation at this  time.   ____________________________ Marcina MillardAlexander Mukhtar Shams, MD ap:cs D: 04/24/2014 12:26:38 ET T: 04/24/2014 14:02:52 ET JOB#: [id number]  cc: Marcina MillardAlexander Agness Sibrian, MD, <Dictator> Marcina MillardALEXANDER Jonathan Kirkendoll MD ELECTRONICALLY SIGNED 04/26/2014 8:33

## 2015-03-12 NOTE — Consult Note (Signed)
PATIENT NAME:  Danny Armstrong, Danny Armstrong MR#:  [id number] DATE OF BIRTH:  01-Nov-1941  DATE OF CONSULTATION:  11/15/2014  REFERRING PHYSICIAN:  Dr. Imogene Burn CONSULTING PHYSICIAN:  Malinda Mayden D. Juliann Pares, MD  PRIMARY CARE PHYSICIAN: Jerl Mina, MD  CARDIOLOGIST: Marcina Millard, MD  INDICATION: Shortness of breath.   HISTORY OF PRESENT ILLNESS: The patient is a 74 year old white male with history of congestive heart failure with systolic dysfunction, ejection fraction around 25%, came to the Emergency Room with shortness of breath. The patient awoke with shortness of breath. Denied any pain. The patient felt chills but no fever. The patient had a cough with wheezing, but denies any orthopnea or PND. No leg edema. No angina. The patient's oxygen saturation was in the 80s in the Emergency Room. The patient was on BiPAP. BNP was 50,000. Potassium was high at 6.4. He was treated with Kayexalate and D50. Leukocyte was 15. He was treated with Levaquin.   PAST MEDICAL HISTORY: Congestive heart failure, cardiomyopathy, paroxysmal atrial fibrillation, chronic kidney disease, coronary artery disease, CVA, carotid artery stenosis, polycythemia.   PAST SURGICAL HISTORY: Nephrectomy secondary to renal cancer, basal cell cancer, hernia repair, cataract surgery   ALLERGIES: None.   MEDICATIONS: Vitamin D, Norvasc 5 a day, Namenda XR 28 mg, Lopressor 100 a day, Lipitor 40, Keppra 500 twice a day, Lasix 20 a day, iron 325 twice a day, Cardizem 120 mg at bedtime, digoxin 0.125 every day, Coumadin, citalopram 100 mg a day, Benicar 40 a day, aspirin 81 mg a day, Advair Diskus 250/50 once a day, Tylenol 500 mg as needed.   SOCIAL HISTORY: Denies drinking, no alcohol consumption. Former smoker.   FAMILY HISTORY: Hypertension, CVA.   REVIEW OF SYSTEMS: No blackout spells. No syncope. No nausea or vomiting. No fever, no chills, no sweats. No weight loss, no weight gain. No hemoptysis or hematemesis. No bright red blood per  rectum. He has complained of shortness of breath, dyspnea, weakness, fatigue, some chills.   PHYSICAL EXAMINATION: VITAL SIGNS: Blood pressure 150/80, pulse 85, respiratory rate 16, saturations 94% on BiPAP.  HEENT: Normocephalic, atraumatic. Pupils equal and reactive to light.  NECK: Supple. No significant JVD, bruits, or adenopathy.  LUNGS: Bilateral rhonchi, rales in the bases. Adequate air movement.  HEART: Distant. Irregularly irregular.  ABDOMEN: Benign.  EXTREMITIES: Within normal limits.  NEUROLOGIC: Intact.  SKIN: Normal.   DIAGNOSTIC DATA: UA was unremarkable.   Chest x-ray negative.   White count 15, hemoglobin 14.8, platelet count 225,000. Glucose 150, BUN 78, creatinine 4.47, potassium 6.4, chloride 105, bicarb 18, anion gap 10. BNP 50,000. MB 6.5. INR 2.3.   EKG: Normal sinus rhythm, first-degree AV block with PVCs, rate of 95, nonspecific findings.   ASSESSMENT: 1.  Acute respiratory failure. 2.  Acute on chronic congestive heart failure with systolic dysfunction. 3.  Hyperkalemia. 4.  Acute on chronic renal failure. 5.  Borderline troponins. 6.  Leukocytosis. 7.  Hyponatremia. 8.  Coronary artery disease. 9.  Cerebrovascular accident.   PLAN: 1.  Agree with admit. Rule out for myocardial infarction. Continue current therapy. Continue treatment for heart failure. Hold off on diuretics because of renal insufficiency. Treat hyperkalemia with Kayexalate, D50 insulin, bicarbonate. 2.  For renal insufficiency and renal failure, recommend evaluation by nephrology.  3.  Leukocytosis, possible pneumonia. We will consider antibiotics. Continue Levaquin therapy for now.  4.  Continue Coumadin for anticoagulation. Follow up laboratories. Follow up EKGs. Treat the patient medically conservatively. Continue respiratory support. Inhalers as necessary. ____________________________  Mry Lamia D. Juliann Paresallwood, MD ddc:sb D: 11/15/2014 14:06:31 ET T: 11/15/2014 14:27:09  ET JOB#: [id number]  cc: Dwayn Moravek D. Juliann Paresallwood, MD, <Dictator> Alwyn PeaWAYNE D Rilda Bulls MD ELECTRONICALLY SIGNED 12/04/2014 8:49

## 2015-03-12 NOTE — Discharge Summary (Signed)
PATIENT NAME:  Danny Armstrong, Danny Armstrong MR#:  [id number] DATE OF BIRTH:  February 03, 1941  DATE OF ADMISSION:  11/14/2014 DATE OF DISCHARGE:  11/17/2014  PRIMARY CARE PHYSICIAN: Rhona LeavensJames F. Burnett ShengHedrick, MD  FINAL DIAGNOSES:  1.  Acute respiratory failure.  2.  Acute on chronic systolic congestive heart failure.   3.  Hyperkalemia.  4.  Acute on chronic kidney disease, now stage V.  5.  Elevated troponin.  6.  Seizure disorder.  7.  Hypertension.  8.  History of cerebrovascular accident.  9.  Paroxysmal atrial fibrillation.   MEDICATIONS ON DISCHARGE TO HOSPICE HOME: Include Keppra 500 mg twice a day, digoxin 125 mcg every other day--hold if pulse below 50, Celexa 10 mg daily, furosemide 40 mg daily, Cardizem CD 120 mg daily, Roxanol 20 mg/mL 0.5 mL every other hour as needed for shortness of breath, nitroglycerin 0.4 mg sublingually as needed for chest pain, Xanax 0.25 mg every 8 hours as needed for anxiety.   CONSULTANTS DURING THE HOSPITAL COURSE: Included Laverda SorensonSarath C. Kolluru, MD, nephrology; Dr. Gerda Disswayne D. Callwood, MD, cardiology, and the palliative care team, and physical therapy.   LABORATORY AND RADIOLOGICAL DATA DURING THE HOSPITAL COURSE: Included EKG showed sinus rhythm, first-degree AV block. INR 2.3. BNP 49,903. Glucose 150, BUN 78, creatinine 4.47, sodium 133, potassium 6.4, chloride 105, CO2 of 18, calcium 9.4. Troponin 1.6. White blood cell count 15.0, H and H 14.8 and 44.7, platelet count of 225,000. Blood cultures negative. Chest x-ray: Prominent central pulmonary vascularity with left perihilar infiltrate and small effusions, consider asymmetric edema versus pneumonia. Urinalysis negative. Troponin went as high as 3.1 and then trended down to 1.6. Creatinine with diuresis  worsened to 5.27 with a GFR of 11, BUN 99.   HOSPITAL COURSE PER PROBLEM LIST:  1.  For the patient's acute respiratory failure, he remained on oxygen the entire hospital stay and will be sent out on oxygen.  2.  Acute on chronic  systolic congestive heart failure: The patient was diuresed. Kidney function worsened. Lasix was actually held. For comfort measures, can be on Lasix daily.  3.  Hyperkalemia on presentation, likely secondary to the acute kidney failure: This was treated.  4.  Acute on chronic kidney disease, now stage V.: The patient refused dialysis.   DISPOSITION: The patient will be discharged to the Hospice Home.   CODE STATUS: The patient is a DO NOT RESUSCITATE.   DIET: As tolerated.    ACTIVITY: As tolerated. May crush or use liquid when appropriate.   TIME SPENT ON DISCHARGE: 35 minutes.    ____________________________ Herschell Dimesichard J. Renae GlossWieting, MD rjw:ts D: 11/17/2014 13:35:01 ET T: 11/17/2014 14:45:33 ET JOB#: [id number]  cc: Herschell Dimesichard J. Renae GlossWieting, MD, <Dictator> Rhona LeavensJames F. Burnett ShengHedrick, MD [facility]  Salley ScarletICHARD J Ajmal Kathan MD ELECTRONICALLY SIGNED 03/26/15 15:14

## 2015-03-12 NOTE — H&P (Signed)
PATIENT NAME:  Danny Armstrong, Danny Armstrong MR#:  [id number] DATE OF BIRTH:  02-May-1941  DATE OF ADMISSION:  11/14/2014  PRIMARY CARE PHYSICIAN: Rhona Leavens. Burnett Sheng, MD   CHIEF COMPLAINT: Shortness of breath since yesterday.   HISTORY OF PRESENT ILLNESS: A 74 year old Caucasian male with a history of systolic CHF with an ejection fraction of 25%, was sent to ED from home due to shortness of breath since yesterday. The patient is alert, awake, oriented. According to the patient and the patient's son, the patient started to have worsening shortness of breath since yesterday. The patient feels chills, but no fever. The patient also has a cough and wheezing, but he denies any orthopnea or nocturnal dyspnea. No leg edema. He denies any weight gain.   The patient's oxygen saturation decreased to the 80s in the ED. He was placed on BiPAP. The patient's BNP is elevated above 49,000. In addition, the patient's potassium is high at 6.4 and he was treated with Kayexalate, insulin, D50. Since the patient has leukocytosis at 15, the patient was treated with Levaquin 1 dose.   PAST MEDICAL HISTORY:  1.  Congestive heart failure with ejection 25%. 2.  Paroxysmal atrial fibrillation on chronic anticoagulation.  3.  Chronic kidney disease stage IV.  4.  History of kidney cancer.  5.  Coronary artery disease.  6.  Cerebrovascular accident.  7.  Left carotid artery stenosis.  8.  History of polycythemia vera.  PAST SURGICAL HISTORY:  1.  Nephrectomy secondary to renal cell cancer.  2.  Basal cell carcinoma of the face, stage IV.  3.  Hernia repair.  4.  Cataract surgery.   ALLERGIES: None.   HOME MEDICATIONS:  1.  Vitamin D3 at 1000 units once a day.  2.  Norvasc 5 mg p.o. daily.   3.  Namenda XR 28 mg p.o. daily.  4.  Lopressor 100 mg once a day. 5.  Lipitor 40 mg p.o. at bedtime.  6.  Keppra 500 mg p.o. b.i.d. 7.  Lasix 20 mg once a day.  8.  Ferrous sulfate 325 mg p.o. b.i.d. 9.  Cardizem 120 mg p.o. at bedtime.   10.  Digoxin 125 mcg p.o. every other day.  11.  Coumadin 3 mg, 0.5 tablet once a day on Tuesday/Wednesday/Thursday/Friday and 2 mg 1 tablet once a day on Saturday and Sunday.  12.  Citalopram 10 mg p.o. daily.  13.  Benicar 40 mg p.o. once a day.  14.  Aspirin 81 mg p.o. daily.  15.  Advair 250 mcg/50 mcg inhalation twice a day.  16.  Acetaminophen 500 mg 2 tablets p.o. p.r.n. every 6 hours.   SOCIAL HISTORY: The patient denies any smoking, drinking, or illicit drugs, but the patient is a former smoker.   FAMILY HISTORY: Hypertension and CVA.   REVIEW OF SYSTEMS:  CONSTITUTIONAL: The patient denies any fever, but has chills and generalized weakness.  EYES: No double vision or blurry vision.  ENT: No postnasal drip, slurred speech or dysphagia.  CARDIOVASCULAR: No chest pain or palpitation. No orthopnea. No nocturnal dyspnea. No leg edema.  PULMONARY: Positive for cough, sputum, shortness of breath, but no hemoptysis. Possible wheezing.  GASTROINTESTINAL: No abdominal pain, nausea, vomiting, diarrhea. No melena or bloody stool.  GENITOURINARY: No dysuria, hematuria, or incontinence.  SKIN: No rash or jaundice.  NEUROLOGIC: No syncope, loss of consciousness, or seizure.   ENDOCRINE: No polyuria, polydipsia, or heat or cold intolerance.  HEMATOLOGIC: No easy bruising or bleeding.  PHYSICAL EXAMINATION:  VITAL SIGNS: Temperature 98.2 blood pressure 156/80, pulse 87, oxygen saturation 94% on BiPAP.  GENERAL: The patient is awake, alert and oriented, on BiPAP.  HEENT: Pupils round, equal and reactive to light and accommodation. Moist oral mucosa. Clear oropharynx.  NECK: Supple. No JVD or carotid bruit. No lymphadenopathy. No thyromegaly.  CARDIOVASCULAR: S1 and S2. Regular rate and rhythm. No murmurs or gallops.  PULMONARY: Bilateral air entry. No wheezing, but has crackles. No use of accessory muscles to breathe on BiPAP.  ABDOMEN: Soft. No distention or tenderness. No organomegaly.  Bowel sounds present.  EXTREMITIES: No edema, clubbing or cyanosis. No calf tenderness. Bilateral pedal pulses present.  SKIN: No rash or jaundice.  NEUROLOGIC: A and O x 3. No focal deficit. Power 4/5. Sensation intact.   LABORATORY DATA: Urinalysis is negative. Chest x-ray showed pulmonary edema versus pneumonia.  WBC 15, hemoglobin 14.8, platelets 225,000. Troponin 0.16.  Glucose 150, BUN 78, creatinine 4.47, potassium 6.4, chloride 105, bicarbonate 18, anion gap 10. BNP 49,903, CK-MB 6.5. INR 2.3. EKG shows sinus rhythm with first degree AV block with PVC at 95 BPM.   IMPRESSIONS:  1.  Acute respiratory failure.  2.  Acute on chronic systolic congestive heart failure with ejection fraction 25%.  3.  Hyperkalemia.  4.  Acute renal failure on chronic kidney disease.  5.  Elevated troponin, possibly due to demand ischemia.  6.  Leukocytosis with questionable pneumonia.  7.  Hyponatremia.  8.  History of coronary artery disease CVA .   PLAN OF TREATMENT:  1.  The patient will be admitted to a stepdown unit with telemetry monitor. We will start CHF protocol, start Lasix 40 mg IV b.i.d. for elevated troponin which is possibly due to demand ischemia. We will continue aspirin and Coumadin. Follow up INR. We will request a cardiologist consult from Dr. Darrold JunkerParaschos.  2.  For hyperkalemia, the patient was treated with insulin, D50, bicarbonate, and Kayexalate ED. We will monitor potassium level today.  3.  For acute renal failure on chronic kidney disease, I will request a nephrology consult. Close monitor of BMP since the patient is on Lasix.   4.  For leukocytosis and a question of pneumonia, we will continue Levaquin, follow up blood culture and sputum culture, follow up CBC.  5.  I discussed the patient's condition and the plan of treatment with the patient and the patient's son. The patient wants DNR.  CRITICAL TIME SPENT: About 66 minutes.    ____________________________ Shaune PollackQing Oberon Hehir,  MD qc:MT D: 11/14/2014 12:17:27 ET T: 11/14/2014 12:53:05 ET JOB#: [id number]  cc: Shaune PollackQing Jonai Weyland, MD, <Dictator> Shaune PollackQING Deneane Stifter MD ELECTRONICALLY SIGNED 11/14/2014 17:09
